# Patient Record
Sex: Female | Born: 1995
Health system: Southern US, Community
[De-identification: ages and names within clinical notes are randomized; demographics above are authoritative.]

## PROBLEM LIST (undated history)

## (undated) DIAGNOSIS — F902 Attention-deficit hyperactivity disorder, combined type: Secondary | ICD-10-CM

## (undated) DIAGNOSIS — J45909 Unspecified asthma, uncomplicated: Secondary | ICD-10-CM

## (undated) DIAGNOSIS — F909 Attention-deficit hyperactivity disorder, unspecified type: Secondary | ICD-10-CM

## (undated) DIAGNOSIS — R278 Other lack of coordination: Secondary | ICD-10-CM

## (undated) HISTORY — DX: Unspecified asthma, uncomplicated: J45.909

## (undated) HISTORY — DX: Other lack of coordination: R27.8

## (undated) HISTORY — DX: Attention-deficit hyperactivity disorder, unspecified type: F90.9

## (undated) HISTORY — DX: Attention-deficit hyperactivity disorder, combined type: F90.2

---
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
## 2017-01-27 DIAGNOSIS — H5213 Myopia, bilateral: Secondary | ICD-10-CM | POA: Diagnosis not present

## 2017-02-23 ENCOUNTER — Institutional Professional Consult (permissible substitution): Payer: BLUE CROSS/BLUE SHIELD | Admitting: Pediatrics

## 2017-02-25 ENCOUNTER — Telehealth: Payer: Self-pay | Admitting: Pediatrics

## 2017-02-25 NOTE — Telephone Encounter (Signed)
Emailed to patient and mother.

## 2017-03-04 ENCOUNTER — Ambulatory Visit (INDEPENDENT_AMBULATORY_CARE_PROVIDER_SITE_OTHER): Payer: BLUE CROSS/BLUE SHIELD | Admitting: Pediatrics

## 2017-03-04 ENCOUNTER — Encounter: Payer: Self-pay | Admitting: Pediatrics

## 2017-03-04 VITALS — BP 119/85 | HR 96 | Ht 62.0 in | Wt 137.0 lb

## 2017-03-04 DIAGNOSIS — R278 Other lack of coordination: Secondary | ICD-10-CM | POA: Diagnosis not present

## 2017-03-04 DIAGNOSIS — F902 Attention-deficit hyperactivity disorder, combined type: Secondary | ICD-10-CM

## 2017-03-04 DIAGNOSIS — Z79899 Other long term (current) drug therapy: Secondary | ICD-10-CM | POA: Diagnosis not present

## 2017-03-04 DIAGNOSIS — Z7189 Other specified counseling: Secondary | ICD-10-CM | POA: Diagnosis not present

## 2017-03-04 NOTE — Patient Instructions (Addendum)
DISCUSSION: Patient and family counseled regarding the following coordination of care items:  Continue medication as directed Focalin XR 10 mg daily No Rx today, recently filled  Counseled medication administration, effects, and possible side effects.  ADHD medications discussed to include different medications and pharmacologic properties of each. Recommendation for specific medication to include dose, administration, expected effects, possible side effects and the risk to benefit ratio of medication management.  Advised importance of:  Good sleep hygiene (8- 10 hours per night) Limited screen time (none on school nights, no more than 2 hours on weekends) Regular exercise(outside and active play) Healthy eating (drink water, no sodas/sweet tea, limit portions and no seconds).  Follow up with pulmonologist/cardiology as instructed.

## 2017-03-04 NOTE — Progress Notes (Signed)
[facility] [address], [place]. [address] [place] [address] Dept: [phone] Dept Fax: [phone] Loc: [phone] Loc Fax: [phone]  Medical Follow-up  Patient ID: Alice Daniels, female  DOB: 1996/02/18, 21 y.o.  MRN: [id number]  Date of Evaluation: 03/04/17  PCP: [name], PA-C  Accompanied by: self Patient Lives with: mother  Lives with mother Sister lives in [place] - Designer, fashion/clothing program at [employer].  Program will be ending, job search has begun. Bio father not involved  HISTORY/CURRENT STATUS:  Chief Complaint - Polite and cooperative and present for medical follow up for medication management of ADHD, dysgraphia and  Learning differences with anxiety. Last follow up May 2018.  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  Not taking medication daily. Focalin XR 10 mg as needed for classes and work. Planning study abroad to [place] from Sept to December, studying electives (Spanish, exercise science, nutrition, sports culture).  Associated with [employer].  Will be staying on campus housing, dorms with unknonwn roommates situation.  Has on line chatted with others going, knows no one personally.    EDUCATION: School: [employer] - sophomore year Summer class to A/P 1 and got a B Working at [employer] - [employer] [address], part time and full time after school ended. Had some time at school for sorority bid day.  Weston Brass (boyfriend) stayed for visit.  MEDICAL HISTORY: Appetite: WNL  Sleep: Bedtime: 2200  Awakens: Early for work Sleep Concerns: Initiation/Maintenance/Other: Asleep easily, sleeps through the night, feels well-rested.  No Sleep concerns. No concerns for toileting. Daily stool, no constipation or diarrhea. Void urine no difficulty. No enuresis.    Participate in daily oral hygiene to include brushing and flossing.  Individual Medical History/Review of System Changes? Yes Joined track at school "bad idea" pulmonologist in [place], has dropped sats and HR elevated.  Will go to [facility] this summer for specialist. Family history of hypertrophic cardiac myopathy in father and Aunt As above updated had a second visit, feels that it is medication.  But no explanation of why it would drop sats. Will have follow up in January 2019 Has some work stamina challenges with heavy lifting or when it is hot, may get dizzy and sweating.  Allergies: Patient has no known allergies.  Current Medications:  Focalin XR 10 mg daily, especially for work and school  Medication Side Effects: None Letters provided for travel with Class two abroad.  Family Medical/Social History Changes?: No  MENTAL HEALTH: Mental Health Issues: Denies sadness, loneliness or depression.  No self harm or thoughts of self harm or injury. Denies fears, worries and anxieties.  Has good peer relations and is not a bully nor is victimized.  Review of Systems  Constitutional: Negative.   HENT: Negative.   Eyes: Negative.   Respiratory: Negative.   Cardiovascular: Negative.   Gastrointestinal: Negative.   Endocrine: Negative.   Genitourinary: Negative.   Musculoskeletal: Negative.   Skin: Negative.   Allergic/Immunologic: Negative.   Neurological: Negative for dizziness, seizures, syncope, weakness and headaches.  Hematological: Negative.   Psychiatric/Behavioral: Negative for behavioral problems, confusion, decreased concentration, dysphoric mood and sleep disturbance. The patient is nervous/anxious. The patient is not hyperactive.   All other systems reviewed and are negative.   PHYSICAL EXAM: Vitals:  Today's Vitals   03/04/17 1100  BP: 119/85  Pulse: 96  Weight: 137 lb (  62.1 kg)  Height: 5\' 2"  (1.575 m)  , 80 %ile (Z= 0.85) based on CDC 2-20 Years BMI-for-age  data using vitals from 03/04/2017. Body mass index is 25.06 kg/m.  General Exam: Physical Exam  Constitutional: She is oriented to person, place, and time. She appears well-developed and well-nourished.  HENT:  Head: Normocephalic and atraumatic.  Right Ear: External ear normal.  Left Ear: External ear normal.  Mouth/Throat: Oropharynx is clear and moist.  Eyes: Pupils are equal, round, and reactive to light. Conjunctivae and EOM are normal.  Glasses  Neck: Normal range of motion. Neck supple.  Cardiovascular: Normal rate, regular rhythm, normal heart sounds and intact distal pulses.   Pulmonary/Chest: Effort normal and breath sounds normal.  Abdominal: Soft. Bowel sounds are normal.  Musculoskeletal: Normal range of motion.  Neurological: She is alert and oriented to person, place, and time. She has normal reflexes.  Skin: Skin is warm and dry. Capillary refill takes less than 2 seconds.  Psychiatric: She has a normal mood and affect. Her behavior is normal. Judgment and thought content normal.  Vitals reviewed.   Neurological: oriented to time, place, and person  Testing/Developmental Screens: WUJ:WJXBCGI:ASRS 9/12 reviewed and discussed/counseled patient     DIAGNOSES:    ICD-10-CM   1. ADHD (attention deficit hyperactivity disorder), combined type F90.2   2. Dysgraphia R27.8   3. Medication management Z79.899   4. Counseling and coordination of care Z71.89   5. Counseling on health promotion and disease prevention Z71.89     RECOMMENDATIONS:  Patient Instructions  DISCUSSION: Patient and family counseled regarding the following coordination of care items:  Continue medication as directed Focalin XR 10 mg daily No Rx today, recently filled  Counseled medication administration, effects, and possible side effects.  ADHD medications discussed to include different medications and pharmacologic properties of each. Recommendation for specific medication to include dose,  administration, expected effects, possible side effects and the risk to benefit ratio of medication management.  Advised importance of:  Good sleep hygiene (8- 10 hours per night) Limited screen time (none on school nights, no more than 2 hours on weekends) Regular exercise(outside and active play) Healthy eating (drink water, no sodas/sweet tea, limit portions and no seconds).  Follow up with pulmonologist/cardiology as instructed.        Patient verbalized understanding of all topics discussed.   NEXT APPOINTMENT: Return in about 3 months (around 06/03/2017) for Medical Follow up. Medical Decision-making: More than 50% of the appointment was spent counseling and discussing diagnosis and management of symptoms with the patient and family.   Leticia PennaBobi A Abigaile Rossie, NP Counseling Time: 40 Total Contact Time: 50

## 2017-03-12 ENCOUNTER — Other Ambulatory Visit (HOSPITAL_COMMUNITY): Payer: Self-pay | Admitting: Obstetrics & Gynecology

## 2017-03-12 DIAGNOSIS — Z3041 Encounter for surveillance of contraceptive pills: Secondary | ICD-10-CM

## 2017-03-23 ENCOUNTER — Other Ambulatory Visit: Payer: Self-pay | Admitting: *Deleted

## 2017-03-23 DIAGNOSIS — Z3041 Encounter for surveillance of contraceptive pills: Secondary | ICD-10-CM

## 2017-03-23 MED ORDER — NORGESTREL-ETHINYL ESTRADIOL 0.3-30 MG-MCG PO TABS: ORAL_TABLET | ORAL | 1 refills | Status: DC

## 2017-03-23 NOTE — Telephone Encounter (Signed)
[facility] Cry[facility]le[facility]  Pt may have 1 RF but no additional until appt made.  Last visit with Dr Marice Potter 05/2012

## 2017-06-06 ENCOUNTER — Other Ambulatory Visit (HOSPITAL_COMMUNITY): Payer: Self-pay | Admitting: Obstetrics & Gynecology

## 2017-06-06 DIAGNOSIS — Z3041 Encounter for surveillance of contraceptive pills: Secondary | ICD-10-CM

## 2017-09-03 ENCOUNTER — Telehealth: Payer: Self-pay | Admitting: Pediatrics

## 2017-09-03 ENCOUNTER — Ambulatory Visit: Payer: BLUE CROSS/BLUE SHIELD | Admitting: Pediatrics

## 2017-09-03 ENCOUNTER — Encounter: Payer: Self-pay | Admitting: Pediatrics

## 2017-09-03 VITALS — BP 108/81 | HR 89 | Ht 62.25 in | Wt 137.0 lb

## 2017-09-03 DIAGNOSIS — Z79899 Other long term (current) drug therapy: Secondary | ICD-10-CM

## 2017-09-03 DIAGNOSIS — Z719 Counseling, unspecified: Secondary | ICD-10-CM

## 2017-09-03 DIAGNOSIS — R278 Other lack of coordination: Secondary | ICD-10-CM | POA: Diagnosis not present

## 2017-09-03 DIAGNOSIS — F902 Attention-deficit hyperactivity disorder, combined type: Secondary | ICD-10-CM

## 2017-09-03 DIAGNOSIS — Z7189 Other specified counseling: Secondary | ICD-10-CM | POA: Diagnosis not present

## 2017-09-03 MED ORDER — DEXMETHYLPHENIDATE HCL ER 10 MG PO CP24: 10.0000 mg | ORAL_CAPSULE | Freq: Two times a day (BID) | ORAL | 0 refills | Status: DC

## 2017-09-03 NOTE — Telephone Encounter (Signed)
[facility] requesting prior [facility] for Dexmethylphenidate ER 10 mg.  Patient seen today, next appointment 01/28/18.

## 2017-09-03 NOTE — Progress Notes (Signed)
[facility] [address] [place]. [address] [place] [address] Dept: [phone] Dept Fax: [phone] Loc: [phone] Loc Fax: [phone]  Medical Follow-up  Patient ID: Alice Daniels, female  DOB: [birth year], 22 y.o.  MRN: [id number]  Date of Evaluation: 09/03/17  PCP: [name], PA-C  Accompanied by: self Patient Lives with: mother  Bio father not involved. Sister in [place]  HISTORY/CURRENT STATUS:  Chief Complaint - Polite and cooperative and present for medical follow up for medication management of ADHD, dysgraphia and learning differences. Last follow up Sep 2018 and currently prescribed Focalin XR 10 mg every morning, for school/work days.  Went abroad to [place] from Sept to December, studied electives (Spanish, exercise science, nutrition, sports culture). Associated with [employer]. Victory Dakin from [place], Fredric Mare from [place] and [place] from [place].  EDUCATION: School: [employer] - Sophomore year  Last semester abroad - [place] Interesting classes, some were boring some good like "contemp issues in sports" Made two american friends  Performance/Grades: average Services: Other: DSO Activities/Exercise: daily  On Spring break, taking: Anatomy 2, psych something interesting sounds like child development (on-line),H [employer], H ways of knowing - research class (setting up research), Spanish Int, [employer] - paid position  No summer plans, applied for [employer] thing, wants to do [employer] DPT (Doctor of PT)  MEDICAL HISTORY: Appetite: WNL Gluten free Liked the chips/fries, was okay with gluten in [place] ([place], [place], [place] and [place]), [place] - [place], [place] and [place] Didn't go to [place] due to cost and wanted [place]. 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 Sleep Concerns: Initiation/Maintenance/Other: Asleep easily, sleeps through the night, feels well-rested.  No Sleep concerns.. No concerns for toileting. Daily stool, no constipation or diarrhea. Void urine no difficulty. No enuresis.   Participate in daily oral hygiene to include brushing and flossing.  Individual Medical History/Review of System Changes? No, no health scares or concerns while away  Allergies: Patient has no known allergies.  Current Medications:  Focalin XR 10 mg Medication Side Effects: None  Family Medical/Social History Changes?: No  MENTAL HEALTH: Mental Health Issues:  Denies sadness, loneliness or depression. No self harm or thoughts of self harm or injury. Denies fears, worries and anxieties. Has good peer relations and is not a bully nor is victimized.   PHYSICAL EXAM: Vitals:  Today's Vitals   09/03/17 1428  BP: 108/81  Pulse: 89  Weight: 137 lb (62.1 kg)  Height: 5' 2.25" (1.581 m)  , 78 %ile (Z= 0.78) based on CDC (Girls, 2-20 Years) BMI-for-age based on BMI available as of 09/03/2017.  General Exam: Physical Exam  Constitutional: She is oriented to person, place, and time. She appears well-developed and well-nourished.  HENT:  Head: Normocephalic and atraumatic.  Right Ear: External ear normal.  Left Ear: External ear normal.  Mouth/Throat: Oropharynx is clear and moist.  Eyes: Conjunctivae and EOM are normal. Pupils are equal, round, and reactive to light.  Glasses  Neck: Normal range of motion. Neck supple.  Cardiovascular: Normal rate, regular rhythm, normal heart sounds and intact distal pulses.  Pulmonary/Chest: Effort normal and breath sounds normal.    Abdominal: Soft. Bowel sounds are normal.  Musculoskeletal: Normal range of motion.  Neurological: She is alert and oriented to person, place, and time. She has normal reflexes.  Skin: Skin is warm and dry. Capillary refill takes less than 2 seconds.  Psychiatric: She has a normal mood and affect. Her behavior is normal. Judgment and thought content normal.  Vitals reviewed.  Neurological: oriented to place and person  Testing/Developmental Screens: CGI:12/11  Reviewed with patient     DIAGNOSES:    ICD-10-CM   1. ADHD (attention deficit hyperactivity disorder), combined type F90.2   2. Dysgraphia R27.8   3. Medication management Z79.899   4. Patient counseled Z71.9   5. Parenting dynamics counseling Z71.89   6. Counseling and coordination of care Z71.89     RECOMMENDATIONS:  Patient Instructions  DISCUSSION: Patient and family counseled regarding the following coordination of care items:  Continue medication as directed  Focalin XR 10 mg every morning RX for above e-scribed and sent to pharmacy on record  [id number] [place] - [address] [place] [id number] Phone: [phone] Fax: [phone]  Counseled medication administration, effects, and possible side effects.  ADHD medications discussed to include different medications and pharmacologic properties of each. Recommendation for specific medication to include dose, administration, expected effects, possible side effects and the risk to benefit ratio of medication management.  Advised importance of:  Good sleep hygiene (8- 10 hours per night) Limited screen time (none on school nights, no more than 2 hours on weekends) Regular exercise(outside and active play) Healthy eating (drink water, no sodas/sweet tea, limit portions and no seconds).  Counseling at this visit included the review of old records and/or current chart with the patient and family.   Counseling  included the following discussion points presented at every visit to improve understanding and treatment compliance.  Recent health history and today's examination Growth and development with anticipatory guidance provided regarding brain growth, executive function maturation and pubertal development School progress and continued advocay for appropriate accommodations to include maintain Structure, routine, organization, reward, motivation and consequences.  Patient verbalized understanding of all topics discussed.  NEXT APPOINTMENT: Return in about 3 months (around 12/04/2017) for Medical Follow up. Medical Decision-making: More than 50% of the appointment was spent counseling and discussing diagnosis and management of symptoms with the patient and family.   Leticia PennaBobi A Avyanna Spada, NP Counseling Time: 40 Total Contact Time: 50

## 2017-09-03 NOTE — Patient Instructions (Addendum)
DISCUSSION: Patient counseled regarding the following coordination of care items:  Continue medication as directed  Focalin XR 10 mg every morning RX for above e-scribed and sent to pharmacy on record  [id number] [place], [place] - [address] [place] [id number] Phone: [phone] Fax: [phone]  Counseled medication administration, effects, and possible side effects.  ADHD medications discussed to include different medications and pharmacologic properties of each. Recommendation for specific medication to include dose, administration, expected effects, possible side effects and the risk to benefit ratio of medication management.  Advised importance of:  Good sleep hygiene (8- 10 hours per night) Limited screen time (none on school nights, no more than 2 hours on weekends) Regular exercise(outside and active play) Healthy eating (drink water, no sodas/sweet tea, limit portions and no seconds).  Counseling at this visit included the review of old records and/or current chart with the patient  Counseling included the following discussion points presented at every visit to improve understanding and treatment compliance.  Recent health history and today's examination Growth and development with anticipatory guidance provided regarding brain growth, executive function maturation and pubertal development School progress and continued advocay for appropriate accommodations to include maintain Structure, routine, organization, reward, motivation and consequences.

## 2017-09-06 NOTE — Telephone Encounter (Signed)
PA submitted via CoverMyMeds.

## 2017-09-08 NOTE — Telephone Encounter (Signed)
Approved Outcome Approvedtoday Effective from 09/06/2017 through 09/04/2020.

## 2017-09-15 ENCOUNTER — Other Ambulatory Visit: Payer: Self-pay | Admitting: Pediatrics

## 2017-09-15 MED ORDER — DEXMETHYLPHENIDATE HCL ER 10 MG PO CP24
10.0000 mg | ORAL_CAPSULE | Freq: Two times a day (BID) | ORAL | 0 refills | Status: DC
Start: 2017-09-15 — End: 2018-01-14

## 2017-09-15 NOTE — Telephone Encounter (Signed)
Due to PA wait, [facility] RX for above e-scribed and sent to pharmacy on record  [id number]- [place], [place] - [address] [place] [id number] Phone: [phone] Fax: [phone]

## 2017-09-15 NOTE — Telephone Encounter (Signed)
Focalin XR 10 mg daily, # 30 with no RF's.   RX for above e-scribed and sent to pharmacy on record  [id number] [place]- [place], [place] - [address] [place] [id number] Phone: [phone] Fax: [phone]

## 2017-11-22 DIAGNOSIS — Z0000 Encounter for general adult medical examination without abnormal findings: Secondary | ICD-10-CM | POA: Diagnosis not present

## 2017-11-22 DIAGNOSIS — Z1331 Encounter for screening for depression: Secondary | ICD-10-CM | POA: Diagnosis not present

## 2017-11-22 DIAGNOSIS — Z68.41 Body mass index (BMI) pediatric, 5th percentile to less than 85th percentile for age: Secondary | ICD-10-CM | POA: Diagnosis not present

## 2017-11-22 DIAGNOSIS — Z713 Dietary counseling and surveillance: Secondary | ICD-10-CM | POA: Diagnosis not present

## 2017-12-20 ENCOUNTER — Other Ambulatory Visit: Payer: Self-pay | Admitting: Obstetrics & Gynecology

## 2017-12-20 DIAGNOSIS — Z3041 Encounter for surveillance of contraceptive pills: Secondary | ICD-10-CM

## 2017-12-20 MED ORDER — NORGESTREL-ETHINYL ESTRADIOL 0.3-30 MG-MCG PO TABS: ORAL_TABLET | ORAL | 4 refills | Status: DC

## 2017-12-20 NOTE — Progress Notes (Signed)
Refill of birth control

## 2018-01-14 ENCOUNTER — Other Ambulatory Visit: Payer: Self-pay

## 2018-01-14 MED ORDER — DEXMETHYLPHENIDATE HCL ER 10 MG PO CP24: 10.0000 mg | ORAL_CAPSULE | Freq: Two times a day (BID) | ORAL | 0 refills | Status: DC

## 2018-01-14 NOTE — Telephone Encounter (Signed)
Patient emailed that he needed refill for Focalin. Last visit 09/03/2017 next visit 01/28/2018. [facility] [place] [place]

## 2018-01-14 NOTE — Telephone Encounter (Signed)
RX for above e-scribed and sent to pharmacy on record  [id number] [place], [place] - [address] [place] [address] Phone: [phone] Fax: [phone]   

## 2018-01-28 ENCOUNTER — Ambulatory Visit: Payer: BLUE CROSS/BLUE SHIELD | Admitting: Pediatrics

## 2018-01-28 ENCOUNTER — Encounter: Payer: Self-pay | Admitting: Pediatrics

## 2018-01-28 VITALS — BP 117/81 | HR 94 | Ht 62.5 in | Wt 141.0 lb

## 2018-01-28 DIAGNOSIS — Z7189 Other specified counseling: Secondary | ICD-10-CM

## 2018-01-28 DIAGNOSIS — Z79899 Other long term (current) drug therapy: Secondary | ICD-10-CM | POA: Diagnosis not present

## 2018-01-28 DIAGNOSIS — Z719 Counseling, unspecified: Secondary | ICD-10-CM | POA: Diagnosis not present

## 2018-01-28 DIAGNOSIS — R278 Other lack of coordination: Secondary | ICD-10-CM | POA: Diagnosis not present

## 2018-01-28 DIAGNOSIS — F902 Attention-deficit hyperactivity disorder, combined type: Secondary | ICD-10-CM

## 2018-01-28 NOTE — Progress Notes (Signed)
[facility] [address] [place] [address] Dept: [phone] Dept Fax: [phone] Loc: [phone] Loc Fax: [phone]  Medical Follow-up  Patient ID: Alice Daniels, female  DOB: 1995/10/06, 22 y.o.  MRN: [id number]  Date of Evaluation: 01/28/18   PCP: [name], PA-C  Accompanied by: self Patient Lives with: mother  HISTORY/CURRENT STATUS:  Chief Complaint - Polite and cooperative and present for medical follow up for medication management of ADHD, dysgraphia and learning differences.  Last follow up March 2019 and currently prescribed Focalin XR 10 mg every morning, reports daily compliance.   EDUCATION: School: [employer]. GP 3.7 Very excited to go back to school -  2 friend/sisters in an suite style dorm Did not live in sorority house. 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 Sleep Concerns: Initiation/Maintenance/Other: not feeling rested, Asleep easily, sleeps through the night, feels well-rested.  No Sleep concerns.  Individual Medical History/Review of System Changes? No  Allergies: Patient has no known allergies.  Current Medications:  Focalin XR 10 mg every morning  Medication Side Effects: None  Family Medical/Social History Changes?: No  MENTAL HEALTH: Mental Health Issues:  Denies sadness, loneliness or depression. No self harm or thoughts of self harm or  injury. Denies fears, worries and anxieties. Has good peer relations and is not a bully nor is victimized.  Review of Systems  Constitutional: Negative.   HENT: Negative.   Eyes: Negative.   Respiratory: Negative.   Cardiovascular: Negative.   Gastrointestinal: Negative.   Endocrine: Negative.   Genitourinary: Negative.   Musculoskeletal: Negative.   Skin: Negative.   Allergic/Immunologic: Negative.   Neurological: Negative for dizziness, seizures, syncope, weakness and headaches.  Hematological: Negative.   Psychiatric/Behavioral: Negative for behavioral problems, confusion, decreased concentration, dysphoric mood and sleep disturbance. The patient is nervous/anxious. The patient is not hyperactive.   All other systems reviewed and are negative.  PHYSICAL EXAM: Vitals:  Today's Vitals   01/28/18 1454  BP: 117/81  Pulse: 94  Weight: 141 lb (64 kg)  Height: 5' 2.5" (1.588 m)  , Facility age limit for growth percentiles is 20 years. Body mass index is 25.38 kg/m.  General Exam: Physical Exam  Constitutional: She is oriented to person, place, and time. She appears well-developed and well-nourished.  HENT:  Head: Normocephalic and atraumatic.  Right Ear: External ear normal.  Left Ear: External ear normal.  Mouth/Throat: Oropharynx is clear and moist.  Eyes: Pupils are equal, round, and reactive to light. Conjunctivae and EOM are normal.  Glasses  Neck: Normal range of motion. Neck supple.  Cardiovascular: Normal rate, regular rhythm, normal heart sounds and intact distal pulses.  Pulmonary/Chest: Effort normal and breath sounds normal.  Abdominal: Soft. Bowel sounds are normal.  Musculoskeletal: Normal range of motion.  Neurological: She is alert and oriented to person, place, and time. She has normal reflexes.  Skin: Skin is warm and dry. Capillary refill takes less than  2 seconds.  Psychiatric: She has a normal mood and affect. Her behavior is normal. Judgment and  thought content normal.  Vitals reviewed.  Neurological: oriented to place and person  Testing/Developmental Screens: CGI:13/16  Reviewed with patient     DIAGNOSES:    ICD-10-CM   1. ADHD (attention deficit hyperactivity disorder), combined type F90.2   2. Dysgraphia R27.8   3. Medication management Z79.899   4. Patient counseled Z71.9   5. Parenting dynamics counseling Z71.89   6. Counseling and coordination of care Z71.89     RECOMMENDATIONS:  Patient Instructions  DISCUSSION: Patient counseled regarding the following coordination of care items:  Continue medication as directed Focalin XR 10 mg every morning No rx needed today.  Counseled medication administration, effects, and possible side effects.  ADHD medications discussed to include different medications and pharmacologic properties of each. Recommendation for specific medication to include dose, administration, expected effects, possible side effects and the risk to benefit ratio of medication management.  Advised importance of:  Good sleep hygiene (8- 10 hours per night) Limited screen time (none on school nights, no more than 2 hours on weekends) Regular exercise(outside and active play) Healthy eating (drink water, no sodas/sweet tea, limit portions and no seconds).  Counseling at this visit included the review of old records and/or current chart with the patient and family.   Counseling included the following discussion points presented at every visit to improve understanding and treatment compliance.  Recent health history and today's examination Growth and development with anticipatory guidance provided regarding brain growth, executive function maturation and pubertal development School progress and continued advocay for appropriate accommodations to include maintain Structure, routine, organization, reward, motivation and consequences.  Additionally the patient was counseled to take medication while  driving.     Patient verbalized understanding of all topics discussed.   NEXT APPOINTMENT: Return in about 3 months (around 04/30/2018) for Medical Follow up. Medical Decision-making: More than 50% of the appointment was spent counseling and discussing diagnosis and management of symptoms with the patient and family.  Leticia PennaBobi A Zowie Lundahl, NP Counseling Time: 40 Total Contact Time: 50

## 2018-01-28 NOTE — Patient Instructions (Addendum)
DISCUSSION: Patient counseled regarding the following coordination of care items:  Continue medication as directed Focalin XR 10 mg every morning No rx needed today.  Counseled medication administration, effects, and possible side effects.  ADHD medications discussed to include different medications and pharmacologic properties of each. Recommendation for specific medication to include dose, administration, expected effects, possible side effects and the risk to benefit ratio of medication management.  Advised importance of:  Good sleep hygiene (8- 10 hours per night) Limited screen time (none on school nights, no more than 2 hours on weekends) Regular exercise(outside and active play) Healthy eating (drink water, no sodas/sweet tea, limit portions and no seconds).  Counseling at this visit included the review of old records and/or current chart with the patient and family.   Counseling included the following discussion points presented at every visit to improve understanding and treatment compliance.  Recent health history and today's examination Growth and development with anticipatory guidance provided regarding brain growth, executive function maturation and pubertal development School progress and continued advocay for appropriate accommodations to include maintain Structure, routine, organization, reward, motivation and consequences.  Additionally the patient was counseled to take medication while driving.

## 2018-02-10 DIAGNOSIS — R079 Chest pain, unspecified: Secondary | ICD-10-CM | POA: Diagnosis not present

## 2018-03-21 ENCOUNTER — Other Ambulatory Visit: Payer: Self-pay

## 2018-03-21 MED ORDER — DEXMETHYLPHENIDATE HCL ER 10 MG PO CP24: 10.0000 mg | ORAL_CAPSULE | Freq: Two times a day (BID) | ORAL | 0 refills | Status: DC

## 2018-03-21 NOTE — Telephone Encounter (Signed)
Patient emailed in for refill for Focalin XR. Last visit 01/28/2018 next visit 06/02/2018. [facility] [place] [place]

## 2018-05-16 ENCOUNTER — Other Ambulatory Visit: Payer: Self-pay | Admitting: Pediatrics

## 2018-05-16 MED ORDER — DEXMETHYLPHENIDATE HCL ER 10 MG PO CP24: 10.0000 mg | ORAL_CAPSULE | Freq: Two times a day (BID) | ORAL | 0 refills | Status: DC

## 2018-05-16 NOTE — Telephone Encounter (Signed)
RX for above e-scribed and sent to pharmacy on record  [id number] [place], [place] - [address] [place] [address] Phone: [phone] Fax: [phone]   

## 2018-06-02 ENCOUNTER — Institutional Professional Consult (permissible substitution): Payer: BLUE CROSS/BLUE SHIELD | Admitting: Pediatrics

## 2018-06-06 ENCOUNTER — Ambulatory Visit: Payer: BLUE CROSS/BLUE SHIELD | Admitting: Pediatrics

## 2018-06-06 ENCOUNTER — Encounter: Payer: Self-pay | Admitting: Pediatrics

## 2018-06-06 VITALS — BP 113/87 | HR 75 | Ht 62.0 in | Wt 150.0 lb

## 2018-06-06 DIAGNOSIS — R278 Other lack of coordination: Secondary | ICD-10-CM

## 2018-06-06 DIAGNOSIS — Z79899 Other long term (current) drug therapy: Secondary | ICD-10-CM | POA: Diagnosis not present

## 2018-06-06 DIAGNOSIS — Z7189 Other specified counseling: Secondary | ICD-10-CM

## 2018-06-06 DIAGNOSIS — F902 Attention-deficit hyperactivity disorder, combined type: Secondary | ICD-10-CM | POA: Diagnosis not present

## 2018-06-06 DIAGNOSIS — Z719 Counseling, unspecified: Secondary | ICD-10-CM | POA: Diagnosis not present

## 2018-06-06 NOTE — Progress Notes (Signed)
Patient ID: Alice Daniels, female   DOB: Jul 31, 1995, 22 y.o.   MRN: [id number]   Medication Check  Patient ID: Alice Daniels  DOB: [birth year]  MRN: [id number]  DATE:06/06/18 Alice Asters, PA-C  Accompanied by: Mother Patient Lives with: mother  HISTORY/CURRENT STATUS: Chief Complaint - Polite and cooperative and present for medical follow up for medication management of ADHD, dysgraphia and learning differences. Last follow up 01/28/2018 and currently prescribed Focalin XR 10 mg every morning. Reports good daily compliance and doing well in school for junior year of college.   EDUCATION: School: [employer] Year/Grade: Junior Humanities (A), Chem (B) and lab (A), Exercise Physiology (A), Biology C+ so close to a B Worried about Patent attorney. Planning grad school after college.  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 Two sorority sisters in apartment on campus  MEDICAL HISTORY: Appetite: WNL   Sleep: Bedtime: School - regular good sleep, finals week was a mess Awakens: early for classes   Concerns: Initiation/Maintenance/Other: Asleep easily, sleeps through the night, feels well-rested.  No Sleep concerns. No concerns for toileting. Daily stool, no constipation or diarrhea. Void urine no difficulty. No enuresis.   Participate in daily oral hygiene to include brushing and flossing.  Individual Medical History/ Review of Systems: Changes? :Yes ED visit beginning of semester for chest pain, no real etiology (they said muscle). Normal EKG.  Has history of pulmonary issues and exercise.  Family Medical/ Social History: Changes? No  Current Medications:  Focalin XR 10 mg every morning Medication Side Effects: None  MENTAL HEALTH: Mental Health Issues:  Denies sadness, loneliness or depression. No self harm or thoughts of self harm or injury. Denies fears, worries and anxieties. Has good peer relations and  is not a bully nor is victimized.  Review of Systems  Constitutional: Negative.   HENT: Negative.   Eyes: Negative.   Respiratory: Negative.   Cardiovascular: Negative.   Gastrointestinal: Negative.   Endocrine: Negative.   Genitourinary: Negative.   Musculoskeletal: Negative.   Skin: Negative.   Allergic/Immunologic: Negative.   Neurological: Negative for dizziness, seizures, syncope, weakness and headaches.  Hematological: Negative.   Psychiatric/Behavioral: Negative for behavioral problems, confusion, decreased concentration, dysphoric mood and sleep disturbance. The patient is nervous/anxious. The patient is not hyperactive.   All other systems reviewed and are negative.   PHYSICAL EXAM; Vitals:   06/06/18 1406  Weight: 150 lb (68 kg)  Height: 5\' 2"  (1.575 m)   Body mass index is 27.44 kg/m.  General Physical Exam: Unchanged from previous exam, date:01/28/2018  Testing/Developmental Screens: CGI/ASRS = 15/12 Reviewed with patient     Physical Exam Vitals signs reviewed.  Constitutional:      Appearance: She is well-developed.  HENT:     Head: Normocephalic and atraumatic.     Right Ear: External ear normal.     Left Ear: External ear normal.  Eyes:     Conjunctiva/sclera: Conjunctivae normal.     Pupils: Pupils are equal, round, and reactive to light.     Comments: Glasses  Neck:     Musculoskeletal: Normal range of motion and neck supple.  Cardiovascular:     Rate and Rhythm: Normal rate and regular rhythm.     Heart sounds: Normal heart sounds.  Pulmonary:     Effort: Pulmonary effort is normal.     Breath sounds: Normal breath sounds.  Abdominal:     General: Bowel sounds are normal.  Palpations: Abdomen is soft.  Musculoskeletal: Normal range of motion.  Skin:    General: Skin is warm and dry.     Capillary Refill: Capillary refill takes less than 2 seconds.     Comments: Left forearm tattoo of geometric lotus flower Left ankle tattoo of  geometric elephant  Neurological:     Mental Status: She is alert and oriented to person, place, and time.     Deep Tendon Reflexes: Reflexes are normal and symmetric.  Psychiatric:        Behavior: Behavior normal.        Thought Content: Thought content normal.        Judgment: Judgment normal.    DIAGNOSES:    ICD-10-CM   1. ADHD (attention deficit hyperactivity disorder), combined type F90.2   2. Dysgraphia R27.8   3. Medication management Z79.899   4. Patient counseled Z71.9   5. Counseling and coordination of care Z71.89     RECOMMENDATIONS:  Patient Instructions  DISCUSSION: Patient and family counseled regarding the following coordination of care items:  Continue medication as directed Focalin XR 10 mg twice daily  No rx today.  Counseled medication administration, effects, and possible side effects.  ADHD medications discussed to include different medications and pharmacologic properties of each. Recommendation for specific medication to include dose, administration, expected effects, possible side effects and the risk to benefit ratio of medication management.  Advised importance of:  Good sleep hygiene (8- 10 hours per night) Limited screen time (none on school nights, no more than 2 hours on weekends) Regular exercise(outside and active play) Healthy eating (drink water, no sodas/sweet tea, limit portions and no seconds).  Counseling at this visit included the review of old records and/or current chart with the patient and family.   Counseling included the following discussion points presented at every visit to improve understanding and treatment compliance.  Recent health history and today's examination Growth and development with anticipatory guidance provided regarding brain growth, executive function maturation and pubertal development School progress and continued advocay for appropriate accommodations to include maintain Structure, routine, organization,  reward, motivation and consequences.  Additionally the patient was counseled to take medication while driving.   Patient verbalized understanding of all topics discussed.  NEXT APPOINTMENT:  Return in about 3 months (around 09/05/2018) for Medical Follow up.  Medical Decision-making: More than 50% of the appointment was spent counseling and discussing diagnosis and management of symptoms with the patient and family.  Counseling Time: 25 minutes Total Contact Time: 30 minutes

## 2018-06-06 NOTE — Patient Instructions (Addendum)
DISCUSSION: Patient and family counseled regarding the following coordination of care items:  Continue medication as directed Focalin XR 10 mg twice daily  No rx today.  Counseled medication administration, effects, and possible side effects.  ADHD medications discussed to include different medications and pharmacologic properties of each. Recommendation for specific medication to include dose, administration, expected effects, possible side effects and the risk to benefit ratio of medication management.  Advised importance of:  Good sleep hygiene (8- 10 hours per night) Limited screen time (none on school nights, no more than 2 hours on weekends) Regular exercise(outside and active play) Healthy eating (drink water, no sodas/sweet tea, limit portions and no seconds).  Counseling at this visit included the review of old records and/or current chart with the patient and family.   Counseling included the following discussion points presented at every visit to improve understanding and treatment compliance.  Recent health history and today's examination Growth and development with anticipatory guidance provided regarding brain growth, executive function maturation and pubertal development School progress and continued advocay for appropriate accommodations to include maintain Structure, routine, organization, reward, motivation and consequences.  Additionally the patient was counseled to take medication while driving.

## 2018-07-07 ENCOUNTER — Other Ambulatory Visit: Payer: Self-pay

## 2018-07-07 MED ORDER — DEXMETHYLPHENIDATE HCL ER 10 MG PO CP24: 10.0000 mg | ORAL_CAPSULE | Freq: Two times a day (BID) | ORAL | 0 refills | Status: DC

## 2018-07-07 NOTE — Telephone Encounter (Signed)
RX for above e-scribed and sent to pharmacy on record  [id number] [place], [place] - [address] [place] [address] Phone: [phone] Fax: [phone]    

## 2018-07-07 NOTE — Telephone Encounter (Signed)
Patient emailed in for refill for Focalin XR. Last visit 06/06/2018 next visit 11/21/2018. [facility] [place], [place]

## 2018-08-27 DIAGNOSIS — Z23 Encounter for immunization: Secondary | ICD-10-CM | POA: Diagnosis not present

## 2018-08-27 DIAGNOSIS — Z111 Encounter for screening for respiratory tuberculosis: Secondary | ICD-10-CM | POA: Diagnosis not present

## 2018-08-29 DIAGNOSIS — Z111 Encounter for screening for respiratory tuberculosis: Secondary | ICD-10-CM | POA: Diagnosis not present

## 2018-09-14 ENCOUNTER — Other Ambulatory Visit: Payer: Self-pay

## 2018-09-14 MED ORDER — DEXMETHYLPHENIDATE HCL ER 10 MG PO CP24: 10.0000 mg | ORAL_CAPSULE | Freq: Two times a day (BID) | ORAL | 0 refills | Status: DC

## 2018-09-14 NOTE — Telephone Encounter (Signed)
RX for above e-scribed and sent to pharmacy on record  [id number] [place], [place] - [address] [place] [address] Phone: [phone] Fax: [phone]   

## 2018-09-14 NOTE — Telephone Encounter (Signed)
Patient emailed in for refill for Focalin XR. Last visit 06/06/2018 next visit 11/21/2018. [facility] [address], [place], [place] [address]

## 2018-11-08 ENCOUNTER — Other Ambulatory Visit: Payer: Self-pay

## 2018-11-08 MED ORDER — DEXMETHYLPHENIDATE HCL ER 10 MG PO CP24: 10.0000 mg | ORAL_CAPSULE | Freq: Two times a day (BID) | ORAL | 0 refills | Status: DC

## 2018-11-08 NOTE — Telephone Encounter (Signed)
Patient emailed in for refill for Focalin XR. Last visit 06/06/2018 next visit 11/21/2018. [facility] [place] [place]

## 2018-11-08 NOTE — Telephone Encounter (Signed)
E-Prescribed Focalin XR 10 mg BID directly to  [id number] [place], [place] - [address] [place] [address] Phone: [phone] Fax: [phone]

## 2018-11-21 ENCOUNTER — Other Ambulatory Visit: Payer: Self-pay

## 2018-11-21 ENCOUNTER — Encounter: Payer: Self-pay | Admitting: Pediatrics

## 2018-11-21 ENCOUNTER — Ambulatory Visit (INDEPENDENT_AMBULATORY_CARE_PROVIDER_SITE_OTHER): Payer: BLUE CROSS/BLUE SHIELD | Admitting: Pediatrics

## 2018-11-21 DIAGNOSIS — Z79899 Other long term (current) drug therapy: Secondary | ICD-10-CM | POA: Diagnosis not present

## 2018-11-21 DIAGNOSIS — R278 Other lack of coordination: Secondary | ICD-10-CM | POA: Diagnosis not present

## 2018-11-21 DIAGNOSIS — Z7189 Other specified counseling: Secondary | ICD-10-CM

## 2018-11-21 DIAGNOSIS — F902 Attention-deficit hyperactivity disorder, combined type: Secondary | ICD-10-CM | POA: Diagnosis not present

## 2018-11-21 DIAGNOSIS — Z719 Counseling, unspecified: Secondary | ICD-10-CM

## 2018-11-21 MED ORDER — DEXMETHYLPHENIDATE HCL ER 10 MG PO CP24: 10.0000 mg | ORAL_CAPSULE | Freq: Two times a day (BID) | ORAL | 0 refills | Status: DC

## 2018-11-21 NOTE — Progress Notes (Signed)
[facility] [address] [place] [id number] Dept: [phone] Dept Fax: [phone]  Medication Check by FaceTime due to COVID-19  Patient ID:  Alice Daniels  female DOB: 01/13/1996   23 y.o.   MRN: [id number]   DATE:11/21/18  PCP: Cliffton AstersBrandon, Donna, PA-C  Interviewed: Alice Daniels  Location: mother's home [facility]  Virtual Visit via Video Note Connected with Alice Daniels on 11/21/18 at  2:30 PM EDT by video enabled telemedicine application and verified that I am speaking with the correct person using two identifiers.    I discussed the limitations, risks, security and privacy concerns of performing an evaluation and management service by telephone and the availability of in person appointments. I also discussed with the patient that there may be a patient responsible charge related to this service. The patient expressed understanding and agreed to proceed.  HISTORY OF PRESENT ILLNESS/CURRENT STATUS: Alice LecherDrew Daniels is being followed for medication management for ADHD, dysgraphia and learning differences.   Last visit on 06/06/2018  Alice GowerDrew currently prescribed Focalin XR 10 mg  Takes medication at 0900 am. Eating well (eating breakfast, lunch and dinner).   Sleeping: bedtime 2300- 2400 pm and wakes at 0900-1000  sleeping through the night.   EDUCATION: School: [employer]   Grades were good had to finish on line, prefers real classes Did not have any zoom instructions mostly self study and do at your own pace. Chem with lab (excel sheets for last three labs) Warehouse managerBio mechanics with lab (concept questions to bundle concepts learned) Exercise eval prescription with lab (skips some) Internship H course Course assistant [employer]  Had summer internship at [employer], but did not open under phase 2, may not be until end of June early July Studying for GRE - got time and  half Applying for grad school, wants [employer] Physical therapy  Alice GowerDrew is currently out of school for social distancing due to COVID-19.   Activities/ Exercise: daily, not a lot of exercise brain not with it.  encouraged walking  Screen time: (phone, tablet, TV, computer): not excessive  MEDICAL HISTORY: Individual Medical History/ Review of Systems: Changes? :No  Family Medical/ Social History: Changes? No   Patient Lives with: mother  Current Medications:  Focalin XR 10 mg  Medication Side Effects: None  MENTAL HEALTH: Mental Health Issues:    Denies sadness, loneliness or depression. No self harm or thoughts of self harm or injury. Denies fears, worries and anxieties. Has good peer relations and is not a bully nor is victimized.  DIAGNOSES:    ICD-10-CM   1. ADHD (attention deficit hyperactivity disorder), combined type F90.2   2. Dysgraphia R27.8   3. Medication management Z79.899   4. Patient counseled Z71.9   5. Counseling and coordination of care Z71.89      RECOMMENDATIONS:  Patient Instructions  DISCUSSION: Counseled regarding the following coordination of care items:  Continue medication as directed Focalin XR 10 mg, twice daily RX for above e-scribed and sent to pharmacy on record  [id number] [place] [place] - [address] [place] [id number] Phone: [phone] Fax: [phone]  Counseled medication administration, effects, and possible side effects.  ADHD medications discussed to include different medications and pharmacologic properties of each. Recommendation for specific medication to include dose, administration, expected effects, possible side effects and the risk to benefit ratio of medication management.  Advised  importance of:  Good sleep hygiene (8- 10 hours per night)  Limited screen time (none on school nights, no more than 2 hours on weekends)  Regular  exercise(outside and active play) Daily - move your body Healthy eating (drink water, no sodas/sweet tea)  Regular family meals have been linked to lower levels of adolescent risk-taking behavior.  Adolescents who frequently eat meals with their family are less likely to engage in risk behaviors than those who never or rarely eat with their families.  So it is never too early to start this tradition.       Discussed continued need for routine, structure, motivation, reward and positive reinforcement  Encouraged recommended limitations on TV, tablets, phones, video games and computers for non-educational activities.  Encouraged physical activity and outdoor play, maintaining social distancing.  Discussed how to talk to anxious children about coronavirus.   Referred to [employer] for resources about engaging children who are at home in home and online study.    NEXT APPOINTMENT:  Return in about 6 months (around 05/23/2019) for Medication Check. Please call the office for a sooner appointment if problems arise.  Medical Decision-making: More than 50% of the appointment was spent counseling and discussing diagnosis and management of symptoms with the patient and family.  I discussed the assessment and treatment plan with the patient. The parent was provided an opportunity to ask questions and all were answered. The patient agreed with the plan and demonstrated an understanding of the instructions.   The patient was advised to call back or seek an in-person evaluation if the symptoms worsen or if the condition fails to improve as anticipated.  I provided 25 minutes of non-face-to-face time during this encounter.   Completed record review for 0 minutes prior to the virtual video visit.   Leticia Penna, NP  Counseling Time: 25 minutes   Total Contact Time: 25 minutes

## 2018-11-21 NOTE — Patient Instructions (Addendum)
DISCUSSION: Counseled regarding the following coordination of care items:  Continue medication as directed Focalin XR 10 mg, twice daily RX for above e-scribed and sent to pharmacy on record  [id number] [place], [place] - [address] [place] [address] Phone: [phone] Fax: [phone]  Counseled medication administration, effects, and possible side effects.  ADHD medications discussed to include different medications and pharmacologic properties of each. Recommendation for specific medication to include dose, administration, expected effects, possible side effects and the risk to benefit ratio of medication management.  Advised importance of:  Good sleep hygiene (8- 10 hours per night)  Limited screen time (none on school nights, no more than 2 hours on weekends)  Regular exercise(outside and active play) Daily - move your body Healthy eating (drink water, no sodas/sweet tea)  Regular family meals have been linked to lower levels of adolescent risk-taking behavior.  Adolescents who frequently eat meals with their family are less likely to engage in risk behaviors than those who never or rarely eat with their families.  So it is never too early to start this tradition.

## 2019-01-02 ENCOUNTER — Other Ambulatory Visit: Payer: Self-pay | Admitting: Obstetrics & Gynecology

## 2019-01-02 DIAGNOSIS — Z3041 Encounter for surveillance of contraceptive pills: Secondary | ICD-10-CM

## 2019-01-13 DIAGNOSIS — Z20828 Contact with and (suspected) exposure to other viral communicable diseases: Secondary | ICD-10-CM | POA: Diagnosis not present

## 2019-01-21 DIAGNOSIS — Z20828 Contact with and (suspected) exposure to other viral communicable diseases: Secondary | ICD-10-CM | POA: Diagnosis not present

## 2019-01-30 ENCOUNTER — Other Ambulatory Visit: Payer: Self-pay

## 2019-01-30 ENCOUNTER — Ambulatory Visit (INDEPENDENT_AMBULATORY_CARE_PROVIDER_SITE_OTHER): Payer: BLUE CROSS/BLUE SHIELD | Admitting: Obstetrics & Gynecology

## 2019-01-30 ENCOUNTER — Encounter: Payer: Self-pay | Admitting: Obstetrics & Gynecology

## 2019-01-30 VITALS — BP 129/83 | HR 86 | Wt 158.0 lb

## 2019-01-30 DIAGNOSIS — Z124 Encounter for screening for malignant neoplasm of cervix: Secondary | ICD-10-CM | POA: Diagnosis not present

## 2019-01-30 DIAGNOSIS — Z113 Encounter for screening for infections with a predominantly sexual mode of transmission: Secondary | ICD-10-CM | POA: Diagnosis not present

## 2019-01-30 DIAGNOSIS — Z01419 Encounter for gynecological examination (general) (routine) without abnormal findings: Secondary | ICD-10-CM | POA: Diagnosis not present

## 2019-01-30 DIAGNOSIS — R635 Abnormal weight gain: Secondary | ICD-10-CM | POA: Diagnosis not present

## 2019-01-30 DIAGNOSIS — E559 Vitamin D deficiency, unspecified: Secondary | ICD-10-CM | POA: Diagnosis not present

## 2019-01-30 NOTE — Progress Notes (Signed)
Subjective:    Kimari Lienhard is a 23 y.o. single G53 female who presents for an annual exam. She wants to discuss OCPs. She has been on OCPs for 4+ years. Generally her periods are monthly, lasting about3-4 days. But recently she had some pelvic pain at work, had to leave work, and the pain lasted about 2 hours. She was on her period at the time. With her current period she has not had a repeat of the excruciating pain. The patient is sexually active. GYN screening history: no prior history of gyn screening tests. The patient wears seatbelts: yes. The patient participates in regular exercise: yes. (core workouts) Has the patient ever been transfused or tattooed?: yes. The patient reports that there is not domestic violence in her life.   Menstrual History: OB History    Gravida  0   Para  0   Term  0   Preterm  0   AB  0   Living  0     SAB  0   TAB  0   Ectopic  0   Multiple  0   Live Births              Menarche age: 65 Patient's last menstrual period was 01/26/2019.    The following portions of the patient's history were reviewed and updated as appropriate: allergies, current medications, past family history, past medical history, past social history, past surgical history and problem list.  Review of Systems Pertinent items are noted in HPI.   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 Objective:    BP 129/83   Pulse 86   Wt 158 lb (71.7 kg)   LMP 01/26/2019   BMI 28.90 kg/m   General Appearance:    Alert, cooperative, no distress, appears stated age  Head:    Normocephalic, without obvious abnormality, atraumatic  Eyes:    PERRL, conjunctiva/corneas clear, EOM's intact, fundi    benign, both eyes  Ears:    Normal TM's and external ear canals, both ears  Nose:   Nares normal, septum midline, mucosa normal, no drainage    or sinus tenderness  Throat:   Lips,  mucosa, and tongue normal; teeth and gums normal  Neck:   Supple, symmetrical, trachea midline, no adenopathy;    thyroid:  no enlargement/tenderness/nodules; no carotid   bruit or JVD  Back:     Symmetric, no curvature, ROM normal, no CVA tenderness  Lungs:     Clear to auscultation bilaterally, respirations unlabored  Chest Wall:    No tenderness or deformity   Heart:    Regular rate and rhythm, S1 and S2 normal, no murmur, rub   or gallop  Breast Exam:    No tenderness, masses, or nipple abnormality  Abdomen:     Soft, non-tender, bowel sounds active all four quadrants,    no masses, no organomegaly  Genitalia:    Normal female without lesion, discharge or tenderness, normal size and shape, retroverted, mobile, non-tender, normal adnexal exam, some menstrual bleeding noted      Extremities:   Extremities normal, atraumatic, no cyanosis or edema  Pulses:   2+ and symmetric all extremities  Skin:   Skin color, texture, turgor normal, no rashes or lesions  Lymph nodes:   Cervical, supraclavicular, and axillary nodes normal  Neurologic:   CNII-XII intact, normal strength, sensation and reflexes  throughout  .    Assessment:    Healthy female exam.    Plan:     Thin prep Pap smear. with cotesting STI testing Reassurance given regarding the random pelvic pain last month

## 2019-01-30 NOTE — Progress Notes (Signed)
Pt wants to discuss birth control

## 2019-01-31 ENCOUNTER — Other Ambulatory Visit: Payer: Self-pay | Admitting: Obstetrics & Gynecology

## 2019-01-31 DIAGNOSIS — E559 Vitamin D deficiency, unspecified: Secondary | ICD-10-CM | POA: Insufficient documentation

## 2019-01-31 LAB — HEPATITIS B SURFACE ANTIGEN: Hepatitis B Surface Ag: NONREACTIVE

## 2019-01-31 LAB — RPR: RPR Ser Ql: NONREACTIVE

## 2019-01-31 LAB — HEMOGLOBIN A1C
Hgb A1c MFr Bld: 5.2 % of total Hgb (ref <5.7)
Mean Plasma Glucose: 103 (calc)
eAG (mmol/L): 5.7 (calc)

## 2019-01-31 LAB — HEPATITIS C ANTIBODY
Hepatitis C Ab: NONREACTIVE
SIGNAL TO CUT-OFF: 0.01 (ref <1.00)

## 2019-01-31 LAB — HIV ANTIBODY (ROUTINE TESTING W REFLEX): HIV 1&2 Ab, 4th Generation: NONREACTIVE

## 2019-01-31 LAB — TSH: TSH: 1.57 mIU/L

## 2019-01-31 LAB — VITAMIN D 25 HYDROXY (VIT D DEFICIENCY, FRACTURES): Vit D, 25-Hydroxy: 17 ng/mL — ABNORMAL LOW (ref 30–100)

## 2019-01-31 MED ORDER — VITAMIN D (ERGOCALCIFEROL) 1.25 MG (50000 UNIT) PO CAPS: 50000.0000 [IU] | ORAL_CAPSULE | ORAL | 0 refills | Status: DC

## 2019-01-31 NOTE — Progress Notes (Signed)
Vitamin d prescribed for deficiency Patient notified via Mychart.

## 2019-02-01 LAB — CYTOLOGY - PAP
Chlamydia: NEGATIVE
Diagnosis: NEGATIVE
Neisseria Gonorrhea: NEGATIVE

## 2019-02-03 DIAGNOSIS — Z20828 Contact with and (suspected) exposure to other viral communicable diseases: Secondary | ICD-10-CM | POA: Diagnosis not present

## 2019-03-06 ENCOUNTER — Other Ambulatory Visit: Payer: Self-pay

## 2019-03-06 ENCOUNTER — Telehealth: Payer: Self-pay

## 2019-03-06 MED ORDER — DEXMETHYLPHENIDATE HCL ER 10 MG PO CP24: 10.0000 mg | ORAL_CAPSULE | Freq: Two times a day (BID) | ORAL | 0 refills | Status: DC

## 2019-03-06 NOTE — Telephone Encounter (Signed)
Patient emailed in for refill for Focalin XR. Last visit 11/21/2018 next visit 06/06/2019. [facility] [place] [place]

## 2019-03-06 NOTE — Telephone Encounter (Signed)
RX for above e-scribed and sent to pharmacy on record  [id number] [place], [place] - [address] [place] [address] Phone: [phone] Fax: [phone]    

## 2019-03-06 NOTE — Telephone Encounter (Signed)
Pharm faxed in Prior Auth to Focalin XR. Last visit 11/21/2018 next visit 06/06/2019. Submitting Prior Auth to [facility]

## 2019-03-07 NOTE — Telephone Encounter (Signed)
Outcome Approvedtoday Effective from 03/06/2019 through 03/04/2022.

## 2019-03-15 DIAGNOSIS — Z1159 Encounter for screening for other viral diseases: Secondary | ICD-10-CM | POA: Diagnosis not present

## 2019-04-17 ENCOUNTER — Other Ambulatory Visit: Payer: Self-pay | Admitting: Obstetrics & Gynecology

## 2019-05-12 ENCOUNTER — Other Ambulatory Visit: Payer: Self-pay | Admitting: Pediatrics

## 2019-05-12 MED ORDER — DEXMETHYLPHENIDATE HCL ER 10 MG PO CP24: 10.0000 mg | ORAL_CAPSULE | Freq: Two times a day (BID) | ORAL | 0 refills | Status: DC

## 2019-05-12 NOTE — Telephone Encounter (Signed)
RX for above e-scribed and sent to pharmacy on record  [id number] [place], [place] - [address] [place] [address] Phone: [phone] Fax: [phone]   

## 2019-05-14 DIAGNOSIS — Z20828 Contact with and (suspected) exposure to other viral communicable diseases: Secondary | ICD-10-CM | POA: Diagnosis not present

## 2019-06-06 ENCOUNTER — Encounter: Payer: Self-pay | Admitting: Pediatrics

## 2019-06-06 ENCOUNTER — Ambulatory Visit (INDEPENDENT_AMBULATORY_CARE_PROVIDER_SITE_OTHER): Payer: BC Managed Care – PPO | Admitting: Pediatrics

## 2019-06-06 ENCOUNTER — Other Ambulatory Visit: Payer: Self-pay

## 2019-06-06 DIAGNOSIS — Z79899 Other long term (current) drug therapy: Secondary | ICD-10-CM

## 2019-06-06 DIAGNOSIS — F902 Attention-deficit hyperactivity disorder, combined type: Secondary | ICD-10-CM

## 2019-06-06 DIAGNOSIS — Z7189 Other specified counseling: Secondary | ICD-10-CM

## 2019-06-06 DIAGNOSIS — R278 Other lack of coordination: Secondary | ICD-10-CM

## 2019-06-06 DIAGNOSIS — Z719 Counseling, unspecified: Secondary | ICD-10-CM | POA: Diagnosis not present

## 2019-06-06 NOTE — Patient Instructions (Signed)
DISCUSSION: Counseled regarding the following coordination of care items:  Continue medication as directed Focalin XR 10 mg      Counseled medication administration, effects, and possible side effects.  ADHD medications discussed to include different medications and pharmacologic properties of each. Recommendation for specific medication to include dose, administration, expected effects, possible side effects and the risk to benefit ratio of medication management.  Advised importance of:  Good sleep hygiene (8- 10 hours per night)  Limited screen time (none on school nights, no more than 2 hours on weekends)  Regular exercise(outside and active play)  Healthy eating (drink water, no sodas/sweet tea)  Regular family meals have been linked to lower levels of adolescent risk-taking behavior.  Adolescents who frequently eat meals with their family are less likely to engage in risk behaviors than those who never or rarely eat with their families.  So it is never too early to start this tradition.

## 2019-06-06 NOTE — Progress Notes (Signed)
[facility] [address], [place]. [address] [place] [address] Dept: [phone] Dept Fax: [phone]  Medication Check by FaceTime due to COVID-19  Patient ID:  Alice Daniels  female DOB: 1996-03-26   23 y.o.   MRN: [id number]   DATE:06/06/19  PCP: [name], PA-C  Interviewed: Alice Daniels Location: Her home Provider location: [facility] office  Virtual Visit via Video Note Connected with Alice Daniels on 06/06/19 at  9:00 AM EST by video enabled telemedicine application and verified that I am speaking with the correct person using two identifiers.     I discussed the limitations, risks, security and privacy concerns of performing an evaluation and management service by telephone and the availability of in person appointments. I also discussed with the parent/patient that there may be a patient responsible charge related to this service. The parent/patient expressed understanding and agreed to proceed.  HISTORY OF PRESENT ILLNESS/CURRENT STATUS: Alice Daniels is being followed for medication management for ADHD, dysgraphia and learning differences.   Last visit on 11/21/2018  Hinata currently prescribed Focalin XR 10 mg every morning    Behaviors: doing very well, no concerns  Eating well (eating breakfast, lunch and dinner).   Sleeping: well and Sleeping through the night.   EDUCATION: School: [employer] Year/Grade: Senior year Was hybrid this past semester, will plan same for next semester On campus - class one day per week in person, and other half on line.  Seemed okay, but one teacher was not good, but it is done. Sports psych  Research [employer] (horrible) physics  A and A- grades Waiting to hear from grad schools - [employer] [place] (Jan 2022) wants DPT.  Activities/ Exercise: daily  Screen time: (phone, tablet, TV, computer): non-essential, not  excessive  MEDICAL HISTORY: Individual Medical History/ Review of Systems: Changes? :No Has had some negative tests Family Medical/ Social History: Changes? No   Patient Lives with: mother  Current Medications:  Focalin XR 10 mg every morning  Medication Side Effects: None  MENTAL HEALTH: Mental Health Issues:    Denies sadness, loneliness or depression. No self harm or thoughts of self harm or injury. Denies fears, worries and anxieties. Has good peer relations and is not a bully nor is victimized. Coping doing very well  DIAGNOSES:    ICD-10-CM   1. ADHD (attention deficit hyperactivity disorder), combined type  F90.2   2. Dysgraphia  R27.8   3. Medication management  Z79.899   4. Patient counseled  Z71.9   5. Counseling and coordination of care  Z71.89      RECOMMENDATIONS:  Patient Instructions  DISCUSSION: Counseled regarding the following coordination of care items:  Continue medication as directed Focalin XR 10 mg      Counseled medication administration, effects, and possible side effects.  ADHD medications discussed to include different medications and pharmacologic properties of each. Recommendation for specific medication to include dose, administration, expected effects, possible side effects and the risk to benefit ratio of medication management.  Advised importance of:  Good sleep hygiene (8- 10 hours per night)  Limited screen time (none on school nights, no more than 2 hours on weekends)  Regular exercise(outside and active play)  Healthy eating (drink water, no sodas/sweet tea)  Regular family meals have been linked to lower levels of adolescent risk-taking behavior.  Adolescents who frequently eat meals with their family are less likely to engage in risk  behaviors than those who never or rarely eat with their families.  So it is never too early to start this tradition.       Discussed continued need for routine, structure, motivation,  reward and positive reinforcement  Encouraged recommended limitations on TV, tablets, phones, video games and computers for non-educational activities.  Encouraged physical activity and outdoor play, maintaining social distancing.  Discussed how to talk to anxious children about coronavirus.   Referred to [employer] for resources about engaging children who are at home in home and online study.    NEXT APPOINTMENT:  Return in about 6 months (around 12/05/2019) for Medication Check. Please call the office for a sooner appointment if problems arise.  Medical Decision-making: More than 50% of the appointment was spent counseling and discussing diagnosis and management of symptoms with the parent/patient.  I discussed the assessment and treatment plan with the parent. The parent/patient was provided an opportunity to ask questions and all were answered. The parent/patient agreed with the plan and demonstrated an understanding of the instructions.   The parent/patient was advised to call back or seek an in-person evaluation if the symptoms worsen or if the condition fails to improve as anticipated.  I provided 25 minutes of non-face-to-face time during this encounter.   Completed record review for 0 minutes prior to the virtual video visit.   Len Childs, NP  Counseling Time: 25 minutes   Total Contact Time: 25 minutes

## 2019-06-11 DIAGNOSIS — Z20828 Contact with and (suspected) exposure to other viral communicable diseases: Secondary | ICD-10-CM | POA: Diagnosis not present

## 2019-06-11 DIAGNOSIS — Z03818 Encounter for observation for suspected exposure to other biological agents ruled out: Secondary | ICD-10-CM | POA: Diagnosis not present

## 2019-07-03 ENCOUNTER — Telehealth: Payer: Self-pay

## 2019-07-03 MED ORDER — DEXMETHYLPHENIDATE HCL ER 10 MG PO CP24: 10.0000 mg | ORAL_CAPSULE | Freq: Two times a day (BID) | ORAL | 0 refills | Status: DC

## 2019-07-03 NOTE — Telephone Encounter (Signed)
E-Prescribed Focalin XR 10 directly to  [id number] [place], [place] - [address] [place] [address] Phone: [phone] Fax: [phone]

## 2019-07-03 NOTE — Telephone Encounter (Signed)
Patient called in for refill for Focalin XR. Last visit 06/06/2019. [facility] [place], [place]

## 2019-07-04 DIAGNOSIS — Z20828 Contact with and (suspected) exposure to other viral communicable diseases: Secondary | ICD-10-CM | POA: Diagnosis not present

## 2019-07-26 DIAGNOSIS — R197 Diarrhea, unspecified: Secondary | ICD-10-CM | POA: Diagnosis not present

## 2019-09-13 ENCOUNTER — Other Ambulatory Visit: Payer: Self-pay

## 2019-09-13 MED ORDER — DEXMETHYLPHENIDATE HCL ER 10 MG PO CP24: 10.0000 mg | ORAL_CAPSULE | Freq: Two times a day (BID) | ORAL | 0 refills | Status: DC

## 2019-09-13 NOTE — Telephone Encounter (Signed)
Patient emailed in for refill for Focalin XR. Last visit 06/06/2019. [facility]ribe [facility] [place], [place]

## 2019-09-13 NOTE — Telephone Encounter (Signed)
RX for above e-scribed and sent to pharmacy on record  [facility] [phone] [place], [place] - [address] [place] [address] Phone: [phone] Fax: [phone]

## 2019-11-09 ENCOUNTER — Other Ambulatory Visit: Payer: Self-pay | Admitting: Pediatrics

## 2019-11-09 MED ORDER — DEXMETHYLPHENIDATE HCL ER 10 MG PO CP24: 10.0000 mg | ORAL_CAPSULE | Freq: Two times a day (BID) | ORAL | 0 refills | Status: DC

## 2019-11-09 NOTE — Telephone Encounter (Signed)
RX for above e-scribed and sent to pharmacy on record  [id number] [place], [place] - [address] [place] [address] Phone: [phone] Fax: [phone]   

## 2019-11-27 ENCOUNTER — Ambulatory Visit (INDEPENDENT_AMBULATORY_CARE_PROVIDER_SITE_OTHER): Payer: BC Managed Care – PPO | Admitting: Pediatrics

## 2019-11-27 ENCOUNTER — Other Ambulatory Visit: Payer: Self-pay

## 2019-11-27 ENCOUNTER — Encounter: Payer: Self-pay | Admitting: Pediatrics

## 2019-11-27 DIAGNOSIS — F902 Attention-deficit hyperactivity disorder, combined type: Secondary | ICD-10-CM | POA: Diagnosis not present

## 2019-11-27 DIAGNOSIS — Z719 Counseling, unspecified: Secondary | ICD-10-CM

## 2019-11-27 DIAGNOSIS — R278 Other lack of coordination: Secondary | ICD-10-CM | POA: Diagnosis not present

## 2019-11-27 DIAGNOSIS — Z79899 Other long term (current) drug therapy: Secondary | ICD-10-CM

## 2019-11-27 DIAGNOSIS — Z7189 Other specified counseling: Secondary | ICD-10-CM

## 2019-11-27 MED ORDER — DEXMETHYLPHENIDATE HCL ER 10 MG PO CP24: 10.0000 mg | ORAL_CAPSULE | Freq: Two times a day (BID) | ORAL | 0 refills | Status: DC

## 2019-11-27 NOTE — Patient Instructions (Addendum)
DISCUSSION: Counseled regarding the following coordination of care items:  Continue medication as directed Focalin XR 10 mg every morning RX for above e-scribed and sent to pharmacy on record  [id number] [place], [place] - [address] [place] [address] Phone: [phone] Fax: [phone]  Counseled regarding obtaining refills by calling pharmacy first to use automated refill request then if needed, call our office leaving a detailed message on the refill line.  Counseled medication administration, effects, and possible side effects.  ADHD medications discussed to include different medications and pharmacologic properties of each. Recommendation for specific medication to include dose, administration, expected effects, possible side effects and the risk to benefit ratio of medication management.  Advised importance of:  Good sleep hygiene (8- 10 hours per night)  Limited screen time (none on school nights, no more than 2 hours on weekends)  Regular exercise(outside and active play)  Healthy eating (drink water, no sodas/sweet tea)  Regular family meals have been linked to lower levels of adolescent risk-taking behavior.  Adolescents who frequently eat meals with their family are less likely to engage in risk behaviors than those who never or rarely eat with their families.  So it is never too early to start this tradition.  Counseling at this visit included the review of old records and/or current chart.   Counseling included the following discussion points presented at every visit to improve understanding and treatment compliance.  Recent health history and today's examination Growth and development with anticipatory guidance provided regarding brain growth, executive function maturation and pre or pubertal development. School progress and continued advocay for appropriate accommodations to include maintain Structure, routine, organization,  reward, motivation and consequences.  Additionally the patient was counseled to take medication while driving.

## 2019-11-27 NOTE — Progress Notes (Signed)
Medication Check  Patient ID: Alice Daniels  DOB: [birth year]  MRN: [id number]  DATE:11/27/19 Alice Asters, PA-C  Accompanied by: self Patient Lives with: mother  HISTORY/CURRENT STATUS: Chief Complaint - Polite and cooperative and present for medical follow up for medication management of ADHD, dysgraphia and  Learning differences. Last follow up in person 06/07/2019.  Prescribed Focalin XR 10 mg every morning.  Reports daily medication.  EDUCATION: School: [employer] year at [employer] for DPT school  Mother will be buying house in [place] for employment  Activities/ Exercise: daily  Vacationing with sister and her BF in [place]  Screen time: (phone, tablet, TV, computer): no excessive  MEDICAL HISTORY: Appetite: WNL   Sleep: No concerns Elimination: no concerns  Individual Medical History/ Review of Systems: Changes? :No  Family Medical/ Social History: Changes? Yes  Alice Daniels - looking for work, Education officer, environmental and conditioning.  Current Medications:  Focalin XR 10 mg every morning Medication Side Effects: None  MENTAL HEALTH: Mental Health Issues:  Denies sadness, loneliness or depression. No self harm or thoughts of self harm or injury. Denies fears, worries and anxieties. Has good peer relations and is not a bully nor is victimized.  Review of Systems  Constitutional: Negative.   HENT: Negative.   Eyes: Negative.   Respiratory: Negative.   Cardiovascular: Negative.   Gastrointestinal: Negative.   Endocrine: Negative.   Genitourinary: Negative.   Musculoskeletal: Negative.   Skin: Negative.   Allergic/Immunologic: Negative.   Neurological: Negative for dizziness, seizures, syncope, weakness and headaches.  Hematological: Negative.   Psychiatric/Behavioral: Negative for behavioral problems, confusion, decreased concentration, dysphoric mood and sleep disturbance. The patient is nervous/anxious. The patient is not hyperactive.   All other  systems reviewed and are negative.   PHYSICAL EXAM; There were no vitals filed for this visit. There is no height or weight on file to calculate BMI.  General Physical Exam: Unchanged from previous exam, date: 06/06/2019    DIAGNOSES:    ICD-10-CM   1. ADHD (attention deficit hyperactivity disorder), combined type  F90.2   2. Dysgraphia  R27.8   3. Medication management  Z79.899   4. Patient counseled  Z71.9   5. Parenting dynamics counseling  Z71.89   6. Counseling and coordination of care  Z71.89     RECOMMENDATIONS:  Patient Instructions  DISCUSSION: Counseled regarding the following coordination of care items:  Continue medication as directed Focalin XR 10 mg every morning RX for above e-scribed and sent to pharmacy on record  [id number] [place], [place] - [address] [place] [address] Phone: [phone] Fax: [phone]  Counseled regarding obtaining refills by calling pharmacy first to use automated refill request then if needed, call our office leaving a detailed message on the refill line.  Counseled medication administration, effects, and possible side effects.  ADHD medications discussed to include different medications and pharmacologic properties of each. Recommendation for specific medication to include dose, administration, expected effects, possible side effects and the risk to benefit ratio of medication management.  Advised importance of:  Good sleep hygiene (8- 10 hours per night)  Limited screen time (none on school nights, no more than 2 hours on weekends)  Regular exercise(outside and active play)  Healthy eating (drink water, no sodas/sweet tea)  Regular family meals have been linked to lower levels of adolescent risk-taking behavior.  Adolescents who frequently eat meals with their family are less likely to engage in risk behaviors than those  who never or rarely eat with their families.  So it is never  too early to start this tradition.  Counseling at this visit included the review of old records and/or current chart.   Counseling included the following discussion points presented at every visit to improve understanding and treatment compliance.  Recent health history and today's examination Growth and development with anticipatory guidance provided regarding brain growth, executive function maturation and pre or pubertal development. School progress and continued advocay for appropriate accommodations to include maintain Structure, routine, organization, reward, motivation and consequences.  Additionally the patient was counseled to take medication while driving.      NEXT APPOINTMENT:  Return in about 3 months (around 02/27/2020) for Medical Follow up.  Medical Decision-making: More than 50% of the appointment was spent counseling and discussing diagnosis and management of symptoms with the patient and family.  Counseling Time: 25 minutes Total Contact Time: 30 minutes

## 2019-12-11 DIAGNOSIS — N3000 Acute cystitis without hematuria: Secondary | ICD-10-CM | POA: Diagnosis not present

## 2020-01-04 ENCOUNTER — Telehealth: Payer: Self-pay

## 2020-01-04 NOTE — Telephone Encounter (Signed)
Patient emailed in for refill for Focalin. Last visit 11/27/2019. [facility] [place], [place]

## 2020-01-05 MED ORDER — DEXMETHYLPHENIDATE HCL ER 10 MG PO CP24: 10.0000 mg | ORAL_CAPSULE | Freq: Two times a day (BID) | ORAL | 0 refills | Status: DC

## 2020-01-05 NOTE — Telephone Encounter (Signed)
Focalin XR 10 mg BID, # 60 with no RF's.RX for above e-scribed and sent to pharmacy on record  [facility] [address] [place], [place] - [address] [place] [address] Phone: [phone] Fax: [phone]

## 2020-03-15 ENCOUNTER — Other Ambulatory Visit: Payer: Self-pay

## 2020-03-15 MED ORDER — DEXMETHYLPHENIDATE HCL ER 10 MG PO CP24: 10.0000 mg | ORAL_CAPSULE | Freq: Two times a day (BID) | ORAL | 0 refills | Status: DC

## 2020-03-15 NOTE — Telephone Encounter (Signed)
Patient emailed in for refill for Focalin. Last visit 11/27/2019. [facility] [place], [place]  

## 2020-03-15 NOTE — Telephone Encounter (Signed)
Focalin XR 10 mg 2 daily, # 60 with no RF's RX for above e-scribed and sent to pharmacy on record  [facility] [address] [facility], [place] - [address] [place] [address] Phone: [phone] Fax: [phone]

## 2020-04-19 ENCOUNTER — Other Ambulatory Visit: Payer: Self-pay | Admitting: Pediatrics

## 2020-04-19 MED ORDER — DEXMETHYLPHENIDATE HCL ER 10 MG PO CP24: 10.0000 mg | ORAL_CAPSULE | Freq: Two times a day (BID) | ORAL | 0 refills | Status: AC

## 2020-04-19 NOTE — Telephone Encounter (Signed)
RX for above e-scribed and sent to pharmacy on record  [facility] [address] [place], [place] - [address] [place] [address] Phone: [phone] Fax: [phone]

## 2020-04-22 ENCOUNTER — Telehealth: Payer: Self-pay | Admitting: *Deleted

## 2020-04-22 DIAGNOSIS — Z3041 Encounter for surveillance of contraceptive pills: Secondary | ICD-10-CM

## 2020-04-22 MED ORDER — CRYSELLE-28 0.3-30 MG-MCG PO TABS: ORAL_TABLET | ORAL | 0 refills | Status: AC

## 2020-04-22 NOTE — Telephone Encounter (Signed)
Pt called requesting a RF on her OCP's.  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  She is needing 1 RF on her OCP until that appt.  I spoke with Dr Penne Lash who authorized the RF.  Pt is aware.

## 2020-05-01 ENCOUNTER — Telehealth: Payer: Self-pay | Admitting: Pediatrics

## 2020-05-01 NOTE — Telephone Encounter (Signed)
°  Mailed all requested records to [facility].

## 2020-07-09 ENCOUNTER — Telehealth: Payer: Self-pay | Admitting: *Deleted

## 2020-07-09 NOTE — Telephone Encounter (Signed)
Returned call from 07/05/2020 at 2:55 PM. Patient wants to access records. Left patient a message of how to see her records in MyChart.

## 2021-02-11 DIAGNOSIS — F902 Attention-deficit hyperactivity disorder, combined type: Secondary | ICD-10-CM | POA: Diagnosis not present

## 2021-02-11 DIAGNOSIS — R5383 Other fatigue: Secondary | ICD-10-CM | POA: Diagnosis not present

## 2021-02-11 DIAGNOSIS — F419 Anxiety disorder, unspecified: Secondary | ICD-10-CM | POA: Diagnosis not present
